# Patient Record
Sex: Female | Born: 1965 | Race: White | Hispanic: No | Marital: Married | Smoking: Never smoker
Health system: Southern US, Community
[De-identification: ages and names within clinical notes are randomized; demographics above are authoritative.]

## PROBLEM LIST (undated history)

## (undated) DIAGNOSIS — Z789 Other specified health status: Secondary | ICD-10-CM

## (undated) HISTORY — PX: WISDOM TOOTH EXTRACTION: SHX21

## (undated) HISTORY — PX: LAPAROSCOPIC GASTRIC SLEEVE RESECTION: SHX5895

---

## 2001-09-09 ENCOUNTER — Other Ambulatory Visit: Admission: RE | Admit: 2001-09-09 | Discharge: 2001-09-09 | Payer: Self-pay | Admitting: Obstetrics and Gynecology

## 2001-11-04 ENCOUNTER — Emergency Department (HOSPITAL_COMMUNITY): Admission: EM | Admit: 2001-11-04 | Discharge: 2001-11-05 | Payer: Self-pay | Admitting: Emergency Medicine

## 2001-11-05 ENCOUNTER — Encounter: Payer: Self-pay | Admitting: Emergency Medicine

## 2008-09-09 ENCOUNTER — Emergency Department (HOSPITAL_COMMUNITY): Admission: EM | Admit: 2008-09-09 | Discharge: 2008-09-09 | Payer: Self-pay | Admitting: Emergency Medicine

## 2010-03-01 ENCOUNTER — Encounter: Admission: RE | Admit: 2010-03-01 | Discharge: 2010-03-01 | Payer: Self-pay | Admitting: Obstetrics and Gynecology

## 2010-12-12 LAB — POCT I-STAT, CHEM 8
BUN: 11 mg/dL (ref 6–23)
Calcium, Ion: 1.06 mmol/L — ABNORMAL LOW (ref 1.12–1.32)
Chloride: 106 mEq/L (ref 96–112)
Creatinine, Ser: 0.8 mg/dL (ref 0.4–1.2)
Glucose, Bld: 108 mg/dL — ABNORMAL HIGH (ref 70–99)
HCT: 40 % (ref 36.0–46.0)
Hemoglobin: 13.6 g/dL (ref 12.0–15.0)
Potassium: 3.7 mEq/L (ref 3.5–5.1)
Sodium: 140 mEq/L (ref 135–145)
TCO2: 23 mmol/L (ref 0–100)

## 2010-12-12 LAB — POCT CARDIAC MARKERS
CKMB, poc: <1 ng/mL — ABNORMAL LOW (ref 1.0–8.0)
Myoglobin, poc: 59.1 ng/mL (ref 12–200)
Troponin i, poc: <0.05 ng/mL (ref 0.00–0.09)

## 2010-12-12 LAB — D-DIMER, QUANTITATIVE: D-Dimer, Quant: 0.48 ug/mL-FEU (ref 0.00–0.48)

## 2011-12-21 ENCOUNTER — Other Ambulatory Visit: Payer: Self-pay | Admitting: Family Medicine

## 2011-12-21 DIAGNOSIS — Z1231 Encounter for screening mammogram for malignant neoplasm of breast: Secondary | ICD-10-CM

## 2011-12-26 ENCOUNTER — Ambulatory Visit
Admission: RE | Admit: 2011-12-26 | Discharge: 2011-12-26 | Disposition: A | Discharge status: Home / Self Care | Payer: BC Managed Care – PPO | Source: Ambulatory Visit | Attending: Family Medicine | Admitting: Family Medicine

## 2011-12-26 DIAGNOSIS — Z1231 Encounter for screening mammogram for malignant neoplasm of breast: Secondary | ICD-10-CM

## 2012-10-25 ENCOUNTER — Other Ambulatory Visit: Payer: Self-pay | Admitting: Obstetrics and Gynecology

## 2012-10-28 ENCOUNTER — Encounter (HOSPITAL_COMMUNITY): Payer: Self-pay | Admitting: *Deleted

## 2012-10-28 ENCOUNTER — Encounter (HOSPITAL_COMMUNITY): Payer: Self-pay | Admitting: Pharmacist

## 2012-10-29 ENCOUNTER — Other Ambulatory Visit: Payer: Self-pay | Admitting: Obstetrics and Gynecology

## 2012-10-29 NOTE — H&P (Signed)
NAMEMARESA, Kathy Woods NO.:  [id number]  MEDICAL RECORD NO.:  [id number]  LOCATION:  PERIO                         FACILITY:  [facility]  PHYSICIAN:  Malachi Pro. Ambrose Mantle, M.D. DATE OF BIRTH:  1966-01-02  DATE OF ADMISSION:  10/30/2012 DATE OF DISCHARGE:                             HISTORY & PHYSICAL   PRESENT ILLNESS:  This is a 47 year old white female, para 1-0-0-1, who has a more than 1 year history of abnormal uterine bleeding.  Last menstrual period October 19, 2012, for 6 days.  She has spotted every day since.  Prior September 21, 2012, for 6 days spotted every day until her last menstrual period.  In September, 2013, she reported that her periods were regular, but that she would have a period for week, then for the next 2 weeks, she would have some type of bloody or brown discharge.  She would only have 1 week of the month that had no bleeding.  She stated this had been occurring for 1 year.  The patient was scheduled for an ultrasound and the ultrasound showed really no uterine abnormality.  The endometrial thickness was 7.02 mm, but there was a echo free right ovarian cyst and what was thought to be a hydrosalpinx in the left adnexa.  A left ovarian cyst had a heterogeneous echo pattern that filled the lumen, but the cyst only measured 1.1 x 1.46 cm.  The patient was scheduled for an endometrial biopsy and was unable to sound the uterus with even the smallest dilator.  I tried again 1 month later and again it did not show any evidence that I could sound the uterus, so I was unable to perform the biopsy.  Repeat ultrasound on October 14, 2012, showed an endometrial thickness of only 4.37 mm.  At this time, there was a fibroid that measured 4.6 x 6.8 mm.  A right ovarian cyst, 3.33 cm x 3.36 x 3.42 cm had smooth margins and contained numerous thin septations and low-level echoes.  Cyst was avascular.  A small left ovarian cyst 1.39 x 1.53 cm contained a fairly  homogeneous low level echo pattern.  There was continued presence of a 38 x 15 mm structure inferior and medial to the left ovary that was thought to represent a hydrosalpinx.  Because of the patient's persistent abnormal uterine bleeding, she is admitted now for D and C and hysteroscopy hoping that I will be able to enter the endometrial cavity under anesthesia.  PAST MEDICAL HISTORY:  Reveals allergy to Lipitor.  No latex allergy. She had a C-section.  She has been obese.  SURGICAL HISTORY:  C-section.  SOCIAL HISTORY:  Denies current alcohol, illicit substance, and tobacco use.  MEDICATIONS:  She has been given 2 cycles of progesterone in the last 4 months without benefit.  FAMILY HISTORY:  There is a family history of breast cancer.  PHYSICAL EXAMINATION:  GENERAL:  The patient is 5 feet, 7 and last recorded weight is 293 pounds. HEAD EYES, NOSE, AND THROAT:  Normal. VITAL SIGNS:  Blood pressure is 128/84. HEART:  Normal size and sounds.  No murmurs. NECK:  Thyroid normal size. LUNGS:  Clear to  auscultation. BREASTS:  Soft without masses. ABDOMEN:  Soft, obese with no masses.  Vulva and vagina show a slightly bloody discharge.  The cervix is clean.  The external os of the cervix is open.  The lower part of the uterus feels firm, but 2 ultrasounds that failed to show a significant size fibroid.  The exact size of the uterus is difficult to determine partially because of the patient's obesity.  The adnexa are free of masses.  ADMITTING IMPRESSION:  Persistent abnormal uterine bleeding in spite of hormonal therapy.  The patient is admitted for D and C, hysteroscopy. She has been informed of the risks of surgery including, but not limited to, injury to pelvic organs, perforation of the uterus, bleeding, possibly requiring hysterectomy, deep venous thrombosis, and infection.     Malachi Pro. Ambrose Mantle, M.D.     TFH/MEDQ  D:  10/29/2012  T:  10/29/2012  Job:  [id number]

## 2012-10-30 ENCOUNTER — Ambulatory Visit (HOSPITAL_COMMUNITY)
Admission: RE | Admit: 2012-10-30 | Discharge: 2012-10-30 | Disposition: A | Discharge status: Home / Self Care | Payer: BC Managed Care – PPO | Source: Ambulatory Visit | Attending: Obstetrics and Gynecology | Admitting: Obstetrics and Gynecology

## 2012-10-30 ENCOUNTER — Encounter (HOSPITAL_COMMUNITY): Payer: Self-pay | Admitting: Anesthesiology

## 2012-10-30 ENCOUNTER — Encounter (HOSPITAL_COMMUNITY)
Admission: RE | Disposition: A | Discharge status: Home / Self Care | Payer: Self-pay | Source: Ambulatory Visit | Attending: Obstetrics and Gynecology

## 2012-10-30 ENCOUNTER — Ambulatory Visit (HOSPITAL_COMMUNITY): Payer: BC Managed Care – PPO | Admitting: Anesthesiology

## 2012-10-30 DIAGNOSIS — N938 Other specified abnormal uterine and vaginal bleeding: Secondary | ICD-10-CM | POA: Insufficient documentation

## 2012-10-30 DIAGNOSIS — N949 Unspecified condition associated with female genital organs and menstrual cycle: Secondary | ICD-10-CM | POA: Insufficient documentation

## 2012-10-30 DIAGNOSIS — N882 Stricture and stenosis of cervix uteri: Secondary | ICD-10-CM | POA: Insufficient documentation

## 2012-10-30 DIAGNOSIS — D259 Leiomyoma of uterus, unspecified: Secondary | ICD-10-CM | POA: Insufficient documentation

## 2012-10-30 DIAGNOSIS — N83209 Unspecified ovarian cyst, unspecified side: Secondary | ICD-10-CM | POA: Insufficient documentation

## 2012-10-30 HISTORY — PX: HYSTEROSCOPY W/D&C: SHX1775

## 2012-10-30 HISTORY — DX: Other specified health status: Z78.9

## 2012-10-30 LAB — COMPREHENSIVE METABOLIC PANEL
ALT: 20 U/L (ref 0–35)
AST: 22 U/L (ref 0–37)
CO2: 23 mEq/L (ref 19–32)
Chloride: 104 mEq/L (ref 96–112)
Creatinine, Ser: 0.78 mg/dL (ref 0.50–1.10)
GFR calc Af Amer: >90 mL/min (ref >90)
GFR calc non Af Amer: >90 mL/min (ref >90)
Glucose, Bld: 112 mg/dL — ABNORMAL HIGH (ref 70–99)
Total Bilirubin: 0.3 mg/dL (ref 0.3–1.2)

## 2012-10-30 LAB — URINALYSIS, ROUTINE W REFLEX MICROSCOPIC
Glucose, UA: NEGATIVE mg/dL
Ketones, ur: NEGATIVE mg/dL
Protein, ur: NEGATIVE mg/dL
Urobilinogen, UA: 0.2 mg/dL (ref 0.0–1.0)

## 2012-10-30 LAB — CBC WITH DIFFERENTIAL/PLATELET
Basophils Absolute: 0 10*3/uL (ref 0.0–0.1)
Eosinophils Relative: 1 % (ref 0–5)
HCT: 37.4 % (ref 36.0–46.0)
Hemoglobin: 13 g/dL (ref 12.0–15.0)
Lymphocytes Relative: 27 % (ref 12–46)
Lymphs Abs: 2.6 10*3/uL (ref 0.7–4.0)
MCV: 88.4 fL (ref 78.0–100.0)
Monocytes Absolute: 0.7 10*3/uL (ref 0.1–1.0)
Neutro Abs: 6.2 10*3/uL (ref 1.7–7.7)
RBC: 4.23 MIL/uL (ref 3.87–5.11)
RDW: 12.4 % (ref 11.5–15.5)
WBC: 9.7 10*3/uL (ref 4.0–10.5)

## 2012-10-30 SURGERY — DILATATION AND CURETTAGE /HYSTEROSCOPY
Anesthesia: General | Site: Uterus | Wound class: Clean Contaminated

## 2012-10-30 MED ORDER — LIDOCAINE HCL (CARDIAC) 20 MG/ML IV SOLN
INTRAVENOUS | Status: AC
  Filled 2012-10-30: qty 5

## 2012-10-30 MED ORDER — ONDANSETRON HCL 4 MG/2ML IJ SOLN
INTRAMUSCULAR | Status: DC | PRN
  Administered 2012-10-30: 4 mg via INTRAVENOUS

## 2012-10-30 MED ORDER — LIDOCAINE HCL (CARDIAC) 20 MG/ML IV SOLN
INTRAVENOUS | Status: DC | PRN
  Administered 2012-10-30: 100 mg via INTRAVENOUS

## 2012-10-30 MED ORDER — FENTANYL CITRATE 0.05 MG/ML IJ SOLN
INTRAMUSCULAR | Status: AC
  Filled 2012-10-30: qty 2

## 2012-10-30 MED ORDER — OXYCODONE-ACETAMINOPHEN 5-325 MG PO TABS
ORAL_TABLET | ORAL | Status: AC
  Filled 2012-10-30: qty 1

## 2012-10-30 MED ORDER — PROPOFOL 10 MG/ML IV EMUL
INTRAVENOUS | Status: DC | PRN
  Administered 2012-10-30: 250 mg via INTRAVENOUS

## 2012-10-30 MED ORDER — KETOROLAC TROMETHAMINE 30 MG/ML IJ SOLN
INTRAMUSCULAR | Status: DC | PRN
  Administered 2012-10-30: 30 mg via INTRAVENOUS

## 2012-10-30 MED ORDER — PROPOFOL 10 MG/ML IV EMUL
INTRAVENOUS | Status: AC
  Filled 2012-10-30: qty 20

## 2012-10-30 MED ORDER — FENTANYL CITRATE 0.05 MG/ML IJ SOLN
INTRAMUSCULAR | Status: AC
  Administered 2012-10-30: 50 ug via INTRAVENOUS
  Filled 2012-10-30: qty 2

## 2012-10-30 MED ORDER — GLYCINE 1.5 % IR SOLN
Status: DC | PRN
  Administered 2012-10-30: 3000 mL

## 2012-10-30 MED ORDER — OXYCODONE-ACETAMINOPHEN 5-325 MG PO TABS
1.0000 | ORAL_TABLET | ORAL | Status: DC | PRN
  Administered 2012-10-30: 1 via ORAL

## 2012-10-30 MED ORDER — MIDAZOLAM HCL 5 MG/5ML IJ SOLN
INTRAMUSCULAR | Status: DC | PRN
  Administered 2012-10-30: 2 mg via INTRAVENOUS

## 2012-10-30 MED ORDER — LIDOCAINE HCL 1 % IJ SOLN
INTRAMUSCULAR | Status: DC | PRN
  Administered 2012-10-30: 20 mL

## 2012-10-30 MED ORDER — DEXAMETHASONE SODIUM PHOSPHATE 10 MG/ML IJ SOLN
INTRAMUSCULAR | Status: AC
  Filled 2012-10-30: qty 1

## 2012-10-30 MED ORDER — LACTATED RINGERS IV SOLN
INTRAVENOUS | Status: DC
  Administered 2012-10-30 (×2): via INTRAVENOUS

## 2012-10-30 MED ORDER — ONDANSETRON HCL 4 MG/2ML IJ SOLN: 4.0000 mg | Freq: Once | INTRAMUSCULAR | Status: DC | PRN

## 2012-10-30 MED ORDER — MEPERIDINE HCL 25 MG/ML IJ SOLN: 6.2500 mg | INTRAMUSCULAR | Status: DC | PRN

## 2012-10-30 MED ORDER — ONDANSETRON HCL 4 MG/2ML IJ SOLN
INTRAMUSCULAR | Status: AC
  Filled 2012-10-30: qty 2

## 2012-10-30 MED ORDER — MIDAZOLAM HCL 2 MG/2ML IJ SOLN
INTRAMUSCULAR | Status: AC
  Filled 2012-10-30: qty 2

## 2012-10-30 MED ORDER — FENTANYL CITRATE 0.05 MG/ML IJ SOLN
25.0000 ug | INTRAMUSCULAR | Status: DC | PRN
  Administered 2012-10-30: 50 ug via INTRAVENOUS

## 2012-10-30 MED ORDER — DEXAMETHASONE SODIUM PHOSPHATE 4 MG/ML IJ SOLN
INTRAMUSCULAR | Status: DC | PRN
  Administered 2012-10-30: 10 mg via INTRAVENOUS

## 2012-10-30 MED ORDER — FENTANYL CITRATE 0.05 MG/ML IJ SOLN
INTRAMUSCULAR | Status: DC | PRN
  Administered 2012-10-30: 100 ug via INTRAVENOUS

## 2012-10-30 MED ORDER — KETOROLAC TROMETHAMINE 30 MG/ML IJ SOLN: 15.0000 mg | Freq: Once | INTRAMUSCULAR | Status: DC | PRN

## 2012-10-30 SURGICAL SUPPLY — 14 items
CANISTER SUCTION 2500CC (MISCELLANEOUS) ×2 IMPLANT
CATH ROBINSON RED A/P 16FR (CATHETERS) ×2 IMPLANT
CLOTH BEACON ORANGE TIMEOUT ST (SAFETY) ×2 IMPLANT
CONTAINER PREFILL 10% NBF 60ML (FORM) ×4 IMPLANT
DRESSING TELFA 8X3 (GAUZE/BANDAGES/DRESSINGS) ×2 IMPLANT
ELECT REM PT RETURN 9FT ADLT (ELECTROSURGICAL)
ELECTRODE REM PT RTRN 9FT ADLT (ELECTROSURGICAL) IMPLANT
GLOVE BIO SURGEON STRL SZ7.5 (GLOVE) ×2 IMPLANT
GOWN PREVENTION PLUS XLARGE (GOWN DISPOSABLE) ×2 IMPLANT
GOWN STRL REIN XL XLG (GOWN DISPOSABLE) ×4 IMPLANT
PACK HYSTEROSCOPY LF (CUSTOM PROCEDURE TRAY) ×2 IMPLANT
PAD OB MATERNITY 4.3X12.25 (PERSONAL CARE ITEMS) ×2 IMPLANT
TOWEL OR 17X24 6PK STRL BLUE (TOWEL DISPOSABLE) ×4 IMPLANT
WATER STERILE IRR 1000ML POUR (IV SOLUTION) ×2 IMPLANT

## 2012-10-30 NOTE — Progress Notes (Signed)
Patient ID: Kathy Woods, female   DOB: 1965-09-16, 47 y.o.   MRN: [id number] I examined this lady 10-30-12 and she reports no change in her health since that time.

## 2012-10-30 NOTE — Op Note (Signed)
Operative note on Kathy Woods  Date of the operation: 10/31/2012  Preoperative diagnosis: Abnormal uterine bleeding in spite of 2 cycles of progesterone, cervical stenosis, inability to sound the uterus in the office  Postoperative diagnosis: Same  Operation: D. And C., hysteroscopy  Operator: Ambrose Mantle  Anesthesia MAC, Dr. Arby Barrette CRNA [place]  The patient was brought to the operating room and placed under MAC anesthesia. She was placed in the Wallace stirrups. A timeout was done. She was placed in lithotomy position and the vulva, vagina, perineum and urethra were prepped with Betadine solution.The bladder was catheterized with a [place] catheter and emptied. Exam revealed the external os to be open, the uterus was difficult to feel but was thought to be slightly anterior, normal size, and poorly movable. The adnexa were free of masses. The area was then draped as a sterile field. Speculums were used to identify the cervix which was grasped with a single-tooth tenaculum. The cervical canal was patent but it angulated anteriorly. I dilated the cervix up to where I could easily insert the hysteroscope and visualize the entire endocervical and endometrial canals. There were no polyps or fibroids seen. I then did an endocervical curettage followed by an endometrial curettage and used polyp forceps and baby ring forceps to preserve all the tissue that was removed. The tenaculum was removed and there was no bleeding from the tenaculum site so the procedure was terminated. Blood loss 10-15 cc. Sponge and needle counts correct

## 2012-10-30 NOTE — Anesthesia Procedure Notes (Signed)
Procedure Name: LMA Insertion Date/Time: 10/30/2012 7:33 AM Performed by: Graciela Husbands Pre-anesthesia Checklist: Emergency Drugs available, Timeout performed, Suction available, Patient identified and Patient being monitored Patient Re-evaluated:Patient Re-evaluated prior to inductionOxygen Delivery Method: Circle system utilized Preoxygenation: Pre-oxygenation with 100% oxygen Intubation Type: IV induction Ventilation: Mask ventilation without difficulty LMA: LMA with gastric port inserted LMA Size: 4.0 Number of attempts: 1 Airway Equipment and Method: Patient positioned with wedge pillow Placement Confirmation: breath sounds checked- equal and bilateral and positive ETCO2 Tube secured with: Tape Dental Injury: Teeth and Oropharynx as per pre-operative assessment

## 2012-10-30 NOTE — Anesthesia Postprocedure Evaluation (Signed)
Anesthesia Post Note  Patient: Kathy Woods  Procedure(s) Performed: Procedure(s) (LRB): DILATATION AND CURETTAGE /HYSTEROSCOPY (N/A)  Anesthesia type: General  Patient location: PACU  Post pain: Pain level controlled  Post assessment: Post-op Vital signs reviewed  Last Vitals:  Filed Vitals:   10/30/12 0815  BP: 129/86  Pulse: 79  Temp:   Resp: 16    Post vital signs: Reviewed  Level of consciousness: sedated  Complications: No apparent anesthesia complications

## 2012-10-30 NOTE — Transfer of Care (Signed)
Immediate Anesthesia Transfer of Care Note  Patient: Kathy Woods  Procedure(s) Performed: Procedure(s): DILATATION AND CURETTAGE /HYSTEROSCOPY (N/A)  Patient Location: PACU  Anesthesia Type:General  Level of Consciousness: awake, alert  and oriented  Airway & Oxygen Therapy: Patient Spontanous Breathing and Patient connected to nasal cannula oxygen  Post-op Assessment: Report given to PACU RN and Post -op Vital signs reviewed and stable  Post vital signs: Reviewed and stable  Complications: No apparent anesthesia complications

## 2012-10-30 NOTE — Anesthesia Preprocedure Evaluation (Signed)
Anesthesia Evaluation  Patient identified by MRN, date of birth, ID band  Reviewed: Allergy & Precautions, H&P , NPO status , Patient's Chart, lab work & pertinent test results  Airway Mallampati: III TM Distance: >3 FB Neck ROM: full    Dental no notable dental hx. (+) Teeth Intact   Pulmonary neg pulmonary ROS,    Pulmonary exam normal       Cardiovascular negative cardio ROS      Neuro/Psych negative neurological ROS  negative psych ROS   GI/Hepatic negative GI ROS, Neg liver ROS,   Endo/Other  Morbid obesity  Renal/GU negative Renal ROS  negative genitourinary   Musculoskeletal negative musculoskeletal ROS (+)   Abdominal (+) + obese,   Peds negative pediatric ROS (+)  Hematology negative hematology ROS (+)   Anesthesia Other Findings   Reproductive/Obstetrics negative OB ROS                           Anesthesia Physical Anesthesia Plan  ASA: III  Anesthesia Plan: General   Post-op Pain Management:    Induction: Intravenous  Airway Management Planned:   Additional Equipment:   Intra-op Plan:   Post-operative Plan:   Informed Consent: I have reviewed the patients History and Physical, chart, labs and discussed the procedure including the risks, benefits and alternatives for the proposed anesthesia with the patient or authorized representative who has indicated his/her understanding and acceptance.     Plan Discussed with: CRNA and Surgeon  Anesthesia Plan Comments:         Anesthesia Quick Evaluation

## 2012-10-31 ENCOUNTER — Encounter (HOSPITAL_COMMUNITY): Payer: Self-pay | Admitting: Obstetrics and Gynecology

## 2012-12-05 ENCOUNTER — Other Ambulatory Visit: Payer: Self-pay | Admitting: Obstetrics and Gynecology

## 2012-12-05 DIAGNOSIS — Z1231 Encounter for screening mammogram for malignant neoplasm of breast: Secondary | ICD-10-CM

## 2013-01-14 ENCOUNTER — Ambulatory Visit (INDEPENDENT_AMBULATORY_CARE_PROVIDER_SITE_OTHER): Payer: BC Managed Care – PPO

## 2013-01-14 DIAGNOSIS — Z1231 Encounter for screening mammogram for malignant neoplasm of breast: Secondary | ICD-10-CM

## 2014-04-02 ENCOUNTER — Other Ambulatory Visit: Payer: Self-pay | Admitting: Obstetrics and Gynecology

## 2014-04-02 DIAGNOSIS — Z1231 Encounter for screening mammogram for malignant neoplasm of breast: Secondary | ICD-10-CM

## 2014-04-21 ENCOUNTER — Ambulatory Visit (INDEPENDENT_AMBULATORY_CARE_PROVIDER_SITE_OTHER): Payer: BC Managed Care – PPO

## 2014-04-21 DIAGNOSIS — Z1231 Encounter for screening mammogram for malignant neoplasm of breast: Secondary | ICD-10-CM

## 2014-06-19 ENCOUNTER — Other Ambulatory Visit (HOSPITAL_COMMUNITY): Payer: Self-pay | Admitting: Obstetrics and Gynecology

## 2014-06-19 DIAGNOSIS — N838 Other noninflammatory disorders of ovary, fallopian tube and broad ligament: Secondary | ICD-10-CM

## 2014-06-23 ENCOUNTER — Ambulatory Visit (HOSPITAL_COMMUNITY)
Admission: RE | Admit: 2014-06-23 | Discharge: 2014-06-23 | Disposition: A | Discharge status: Home / Self Care | Payer: BC Managed Care – PPO | Source: Ambulatory Visit | Attending: Obstetrics and Gynecology | Admitting: Obstetrics and Gynecology

## 2014-06-23 DIAGNOSIS — N838 Other noninflammatory disorders of ovary, fallopian tube and broad ligament: Secondary | ICD-10-CM

## 2014-06-23 DIAGNOSIS — N888 Other specified noninflammatory disorders of cervix uteri: Secondary | ICD-10-CM | POA: Diagnosis not present

## 2014-06-23 DIAGNOSIS — N839 Noninflammatory disorder of ovary, fallopian tube and broad ligament, unspecified: Secondary | ICD-10-CM | POA: Diagnosis present

## 2014-06-23 DIAGNOSIS — N8329 Other ovarian cysts: Secondary | ICD-10-CM | POA: Insufficient documentation

## 2015-04-13 ENCOUNTER — Other Ambulatory Visit: Payer: Self-pay | Admitting: Obstetrics and Gynecology

## 2015-04-13 DIAGNOSIS — Z139 Encounter for screening, unspecified: Secondary | ICD-10-CM

## 2015-05-04 ENCOUNTER — Ambulatory Visit (INDEPENDENT_AMBULATORY_CARE_PROVIDER_SITE_OTHER): Payer: BLUE CROSS/BLUE SHIELD

## 2015-05-04 DIAGNOSIS — Z139 Encounter for screening, unspecified: Secondary | ICD-10-CM

## 2015-05-04 DIAGNOSIS — Z1231 Encounter for screening mammogram for malignant neoplasm of breast: Secondary | ICD-10-CM | POA: Diagnosis not present

## 2015-06-08 NOTE — H&P (Addendum)
Kathy Woods is an 49 y.o. female G2P1 with menorrhagia presents for definitive surgical mngt.      Menstrual History: No LMP recorded.    Past Medical History  Diagnosis Date  . Medical history non-contributory     Past Surgical History  Procedure Laterality Date  . Cesarean section    . Wisdom tooth extraction    . Hysteroscopy w/d&c N/A 10/30/2012    Procedure: DILATATION AND CURETTAGE /HYSTEROSCOPY;  Surgeon: Melina Schools, MD;  Location: [facility];  Service: Gynecology;  Laterality: N/A;  **pt had vertical sleeve gastrectomy   Social History:  reports that she has never smoked. She has never used smokeless tobacco. She reports that she does not drink alcohol. Her drug history is not on file.  Allergies:  Allergies  Allergen Reactions  . Lipitor [Atorvastatin] Other (See Comments)    Muscle tightness in arm    No prescriptions prior to admission    ROS  AF, VSS  Physical Exam  Gen - NAD ABd - soft, NT, NT CV - RRR Lungs - clear PV - uterus mobile, NT no adnexal masses  PV [place]:  No uterine fibroids or masses.  No free fluid  Assessment/Plan:  Menorrhagia LAVH/BSO R/b/a discussed, questions answered, informed consent  Kathy Woods 06/08/2015, 5:24 PM

## 2015-06-14 ENCOUNTER — Encounter (HOSPITAL_COMMUNITY): Payer: Self-pay

## 2015-06-14 ENCOUNTER — Encounter (HOSPITAL_COMMUNITY)
Admission: RE | Admit: 2015-06-14 | Discharge: 2015-06-14 | Disposition: A | Discharge status: Home / Self Care | Payer: BLUE CROSS/BLUE SHIELD | Source: Ambulatory Visit | Attending: Obstetrics and Gynecology | Admitting: Obstetrics and Gynecology

## 2015-06-14 DIAGNOSIS — Z01818 Encounter for other preprocedural examination: Secondary | ICD-10-CM | POA: Diagnosis not present

## 2015-06-14 DIAGNOSIS — N920 Excessive and frequent menstruation with regular cycle: Secondary | ICD-10-CM | POA: Insufficient documentation

## 2015-06-14 LAB — CBC
HEMATOCRIT: 39.4 % (ref 36.0–46.0)
Hemoglobin: 13.5 g/dL (ref 12.0–15.0)
MCH: 31.2 pg (ref 26.0–34.0)
MCHC: 34.3 g/dL (ref 30.0–36.0)
MCV: 91 fL (ref 78.0–100.0)
Platelets: 255 10*3/uL (ref 150–400)
RBC: 4.33 MIL/uL (ref 3.87–5.11)
RDW: 12 % (ref 11.5–15.5)
WBC: 6 10*3/uL (ref 4.0–10.5)

## 2015-06-14 LAB — COMPREHENSIVE METABOLIC PANEL
ALK PHOS: 73 U/L (ref 38–126)
ALT: 27 U/L (ref 14–54)
ANION GAP: 4 — AB (ref 5–15)
AST: 28 U/L (ref 15–41)
Albumin: 4 g/dL (ref 3.5–5.0)
BILIRUBIN TOTAL: 0.6 mg/dL (ref 0.3–1.2)
BUN: 14 mg/dL (ref 6–20)
CALCIUM: 8.9 mg/dL (ref 8.9–10.3)
CO2: 27 mmol/L (ref 22–32)
Chloride: 107 mmol/L (ref 101–111)
Creatinine, Ser: 0.7 mg/dL (ref 0.44–1.00)
GFR calc Af Amer: >60 mL/min (ref >60)
Glucose, Bld: 78 mg/dL (ref 65–99)
POTASSIUM: 3.8 mmol/L (ref 3.5–5.1)
Sodium: 138 mmol/L (ref 135–145)
TOTAL PROTEIN: 7.1 g/dL (ref 6.5–8.1)

## 2015-06-14 LAB — TYPE AND SCREEN
ABO/RH(D): O POS
Antibody Screen: NEGATIVE

## 2015-06-14 LAB — ABO/RH: ABO/RH(D): O POS

## 2015-06-14 NOTE — Patient Instructions (Addendum)
   Your procedure is scheduled on: OCT 24 AT 730AM  Enter through the Main Entrance of [facility] at: 6AM  Pick up the phone at the desk and dial [phone] and inform us of your arrival.  Please call this number if you have any problems the morning of surgery: [phone]  DO NOT EAT OR DRINK AFTER MIDNIGHT OCT 23 (SUNDAY)    Do not wear jewelry, make-up, or FINGER nail polish No metal in your hair or on your body. Do not wear lotions, powders, perfumes.  You may wear deodorant.  Do not bring valuables to the hospital. Contacts, dentures or bridgework may not be worn into surgery.  Leave suitcase in the car. After Surgery it may be brought to your room. For patients being admitted to the hospital, checkout time is 11:00am the day of discharge.

## 2015-06-20 MED ORDER — DEXTROSE 5 % IV SOLN
2.0000 g | INTRAVENOUS | Status: AC
  Administered 2015-06-21: 2 g via INTRAVENOUS
  Filled 2015-06-20: qty 2

## 2015-06-20 MED ORDER — CEFOTETAN DISODIUM 2 G IJ SOLR
2.0000 g | INTRAMUSCULAR | Status: DC
  Filled 2015-06-20: qty 2

## 2015-06-21 ENCOUNTER — Encounter (HOSPITAL_COMMUNITY): Payer: Self-pay

## 2015-06-21 ENCOUNTER — Ambulatory Visit (HOSPITAL_COMMUNITY): Payer: BLUE CROSS/BLUE SHIELD | Admitting: Anesthesiology

## 2015-06-21 ENCOUNTER — Encounter (HOSPITAL_COMMUNITY)
Admission: AD | Disposition: A | Discharge status: Home / Self Care | Payer: Self-pay | Source: Ambulatory Visit | Attending: Obstetrics and Gynecology

## 2015-06-21 ENCOUNTER — Inpatient Hospital Stay (HOSPITAL_COMMUNITY)
Admission: AD | Admit: 2015-06-21 | Discharge: 2015-06-23 | DRG: 743 | Disposition: A | Discharge status: Home / Self Care | Payer: BLUE CROSS/BLUE SHIELD | Source: Ambulatory Visit | Attending: Obstetrics and Gynecology | Admitting: Obstetrics and Gynecology

## 2015-06-21 DIAGNOSIS — N920 Excessive and frequent menstruation with regular cycle: Principal | ICD-10-CM | POA: Diagnosis present

## 2015-06-21 DIAGNOSIS — N809 Endometriosis, unspecified: Secondary | ICD-10-CM | POA: Diagnosis present

## 2015-06-21 HISTORY — PX: SALPINGOOPHORECTOMY: SHX82

## 2015-06-21 HISTORY — PX: LAPAROSCOPY: SHX197

## 2015-06-21 HISTORY — PX: LYSIS OF ADHESION: SHX5961

## 2015-06-21 HISTORY — PX: ABDOMINAL HYSTERECTOMY: SHX81

## 2015-06-21 SURGERY — SALPINGO-OOPHORECTOMY, OPEN
Anesthesia: General | Site: Abdomen

## 2015-06-21 MED ORDER — ACETAMINOPHEN 160 MG/5ML PO SOLN
975.0000 mg | Freq: Once | ORAL | Status: AC
  Administered 2015-06-21: 975 mg via ORAL

## 2015-06-21 MED ORDER — SUCCINYLCHOLINE CHLORIDE 20 MG/ML IJ SOLN
INTRAMUSCULAR | Status: AC
  Filled 2015-06-21: qty 1

## 2015-06-21 MED ORDER — KETOROLAC TROMETHAMINE 30 MG/ML IJ SOLN
INTRAMUSCULAR | Status: DC | PRN
  Administered 2015-06-21: 30 mg via INTRAVENOUS

## 2015-06-21 MED ORDER — ONDANSETRON HCL 4 MG/2ML IJ SOLN: 4.0000 mg | Freq: Four times a day (QID) | INTRAMUSCULAR | Status: DC | PRN

## 2015-06-21 MED ORDER — BUPIVACAINE HCL (PF) 0.25 % IJ SOLN
INTRAMUSCULAR | Status: AC
  Filled 2015-06-21: qty 30

## 2015-06-21 MED ORDER — MIDAZOLAM HCL 2 MG/2ML IJ SOLN
INTRAMUSCULAR | Status: AC
  Filled 2015-06-21: qty 4

## 2015-06-21 MED ORDER — SCOPOLAMINE 1 MG/3DAYS TD PT72
MEDICATED_PATCH | TRANSDERMAL | Status: AC
  Filled 2015-06-21: qty 1

## 2015-06-21 MED ORDER — SCOPOLAMINE 1 MG/3DAYS TD PT72
1.0000 | MEDICATED_PATCH | Freq: Once | TRANSDERMAL | Status: DC
  Administered 2015-06-21: 1.5 mg via TRANSDERMAL

## 2015-06-21 MED ORDER — BUPIVACAINE HCL (PF) 0.25 % IJ SOLN
INTRAMUSCULAR | Status: DC | PRN
  Administered 2015-06-21: 5 mL

## 2015-06-21 MED ORDER — CEFAZOLIN SODIUM-DEXTROSE 2-3 GM-% IV SOLR
INTRAVENOUS | Status: AC
  Filled 2015-06-21: qty 50

## 2015-06-21 MED ORDER — ONDANSETRON HCL 4 MG/2ML IJ SOLN
INTRAMUSCULAR | Status: DC | PRN
  Administered 2015-06-21: 4 mg via INTRAVENOUS

## 2015-06-21 MED ORDER — ROCURONIUM BROMIDE 100 MG/10ML IV SOLN
INTRAVENOUS | Status: DC | PRN
  Administered 2015-06-21: 10 mg via INTRAVENOUS
  Administered 2015-06-21: 50 mg via INTRAVENOUS
  Administered 2015-06-21 (×2): 10 mg via INTRAVENOUS

## 2015-06-21 MED ORDER — MIDAZOLAM HCL 5 MG/5ML IJ SOLN
INTRAMUSCULAR | Status: DC | PRN
  Administered 2015-06-21: 2 mg via INTRAVENOUS

## 2015-06-21 MED ORDER — KETOROLAC TROMETHAMINE 30 MG/ML IJ SOLN
INTRAMUSCULAR | Status: AC
  Filled 2015-06-21: qty 1

## 2015-06-21 MED ORDER — SODIUM CHLORIDE 0.9 % IJ SOLN: 9.0000 mL | INTRAMUSCULAR | Status: DC | PRN

## 2015-06-21 MED ORDER — HYDROMORPHONE HCL 1 MG/ML IJ SOLN: 0.2500 mg | INTRAMUSCULAR | Status: DC | PRN

## 2015-06-21 MED ORDER — GLYCOPYRROLATE 0.2 MG/ML IJ SOLN
INTRAMUSCULAR | Status: AC
  Filled 2015-06-21: qty 4

## 2015-06-21 MED ORDER — ROCURONIUM BROMIDE 100 MG/10ML IV SOLN
INTRAVENOUS | Status: AC
  Filled 2015-06-21: qty 1

## 2015-06-21 MED ORDER — DIPHENHYDRAMINE HCL 12.5 MG/5ML PO ELIX: 12.5000 mg | ORAL_SOLUTION | Freq: Four times a day (QID) | ORAL | Status: DC | PRN

## 2015-06-21 MED ORDER — HYDROMORPHONE HCL 1 MG/ML IJ SOLN
0.2500 mg | INTRAMUSCULAR | Status: DC | PRN
  Administered 2015-06-21 (×4): 0.5 mg via INTRAVENOUS

## 2015-06-21 MED ORDER — OXYCODONE-ACETAMINOPHEN 5-325 MG PO TABS
1.0000 | ORAL_TABLET | ORAL | Status: DC | PRN
  Administered 2015-06-22 (×3): 1 via ORAL
  Filled 2015-06-21 (×3): qty 1

## 2015-06-21 MED ORDER — LACTATED RINGERS IV SOLN
INTRAVENOUS | Status: DC
  Administered 2015-06-21: 125 mL/h via INTRAVENOUS
  Administered 2015-06-21 (×2): via INTRAVENOUS

## 2015-06-21 MED ORDER — LIDOCAINE HCL (CARDIAC) 20 MG/ML IV SOLN
INTRAVENOUS | Status: AC
  Filled 2015-06-21: qty 5

## 2015-06-21 MED ORDER — HYDROMORPHONE 1 MG/ML IV SOLN
INTRAVENOUS | Status: DC
  Administered 2015-06-21: 0.2 mg via INTRAVENOUS
  Administered 2015-06-21: 12:00:00 via INTRAVENOUS
  Administered 2015-06-21: 1.8 mg via INTRAVENOUS
  Administered 2015-06-22: 0.6 mL via INTRAVENOUS
  Administered 2015-06-22: 0.8 mg via INTRAVENOUS
  Filled 2015-06-21: qty 25

## 2015-06-21 MED ORDER — ONDANSETRON HCL 4 MG PO TABS: 4.0000 mg | ORAL_TABLET | Freq: Four times a day (QID) | ORAL | Status: DC | PRN

## 2015-06-21 MED ORDER — PROPOFOL 10 MG/ML IV BOLUS
INTRAVENOUS | Status: DC | PRN
  Administered 2015-06-21: 200 mg via INTRAVENOUS

## 2015-06-21 MED ORDER — BUPIVACAINE LIPOSOME 1.3 % IJ SUSP
20.0000 mL | Freq: Once | INTRAMUSCULAR | Status: AC
  Administered 2015-06-21: 20 mL
  Filled 2015-06-21: qty 20

## 2015-06-21 MED ORDER — HYDROMORPHONE HCL 1 MG/ML IJ SOLN
INTRAMUSCULAR | Status: AC
  Filled 2015-06-21: qty 1

## 2015-06-21 MED ORDER — NEOSTIGMINE METHYLSULFATE 10 MG/10ML IV SOLN
INTRAVENOUS | Status: DC | PRN
  Administered 2015-06-21: 5 mg via INTRAVENOUS

## 2015-06-21 MED ORDER — DEXAMETHASONE SODIUM PHOSPHATE 10 MG/ML IJ SOLN
INTRAMUSCULAR | Status: DC | PRN
  Administered 2015-06-21: 10 mg via INTRAVENOUS

## 2015-06-21 MED ORDER — ONDANSETRON HCL 4 MG/2ML IJ SOLN
INTRAMUSCULAR | Status: AC
  Filled 2015-06-21: qty 2

## 2015-06-21 MED ORDER — DIPHENHYDRAMINE HCL 50 MG/ML IJ SOLN: 12.5000 mg | Freq: Four times a day (QID) | INTRAMUSCULAR | Status: DC | PRN

## 2015-06-21 MED ORDER — PROPOFOL 10 MG/ML IV BOLUS
INTRAVENOUS | Status: AC
  Filled 2015-06-21: qty 20

## 2015-06-21 MED ORDER — GLYCOPYRROLATE 0.2 MG/ML IJ SOLN
INTRAMUSCULAR | Status: DC | PRN
  Administered 2015-06-21: .8 mg via INTRAVENOUS

## 2015-06-21 MED ORDER — 0.9 % SODIUM CHLORIDE (POUR BTL) OPTIME
TOPICAL | Status: DC | PRN
  Administered 2015-06-21: 1000 mL

## 2015-06-21 MED ORDER — LIDOCAINE HCL (CARDIAC) 20 MG/ML IV SOLN
INTRAVENOUS | Status: DC | PRN
  Administered 2015-06-21: 100 mg via INTRAVENOUS

## 2015-06-21 MED ORDER — MENTHOL 3 MG MT LOZG: 1.0000 | LOZENGE | OROMUCOSAL | Status: DC | PRN

## 2015-06-21 MED ORDER — SODIUM CHLORIDE 0.9 % IJ SOLN
INTRAMUSCULAR | Status: AC
  Filled 2015-06-21: qty 20

## 2015-06-21 MED ORDER — FENTANYL CITRATE (PF) 100 MCG/2ML IJ SOLN
INTRAMUSCULAR | Status: DC | PRN
  Administered 2015-06-21 (×2): 100 ug via INTRAVENOUS
  Administered 2015-06-21: 50 ug via INTRAVENOUS

## 2015-06-21 MED ORDER — DEXTROSE IN LACTATED RINGERS 5 % IV SOLN
INTRAVENOUS | Status: DC
  Administered 2015-06-21 (×2): via INTRAVENOUS

## 2015-06-21 MED ORDER — KETOROLAC TROMETHAMINE 30 MG/ML IJ SOLN
30.0000 mg | Freq: Three times a day (TID) | INTRAMUSCULAR | Status: AC
  Administered 2015-06-21 – 2015-06-22 (×3): 30 mg via INTRAVENOUS
  Filled 2015-06-21 (×3): qty 1

## 2015-06-21 MED ORDER — NEOSTIGMINE METHYLSULFATE 10 MG/10ML IV SOLN
INTRAVENOUS | Status: AC
  Filled 2015-06-21: qty 1

## 2015-06-21 MED ORDER — FENTANYL CITRATE (PF) 250 MCG/5ML IJ SOLN
INTRAMUSCULAR | Status: AC
  Filled 2015-06-21: qty 25

## 2015-06-21 MED ORDER — ACETAMINOPHEN 160 MG/5ML PO SOLN
ORAL | Status: AC
  Filled 2015-06-21: qty 40.6

## 2015-06-21 MED ORDER — NALOXONE HCL 0.4 MG/ML IJ SOLN: 0.4000 mg | INTRAMUSCULAR | Status: DC | PRN

## 2015-06-21 MED ORDER — DEXAMETHASONE SODIUM PHOSPHATE 10 MG/ML IJ SOLN
INTRAMUSCULAR | Status: AC
  Filled 2015-06-21: qty 2

## 2015-06-21 SURGICAL SUPPLY — 47 items
ATTRACTOMAT 16X20 MAGNETIC DRP (DRAPES) ×5 IMPLANT
CABLE HIGH FREQUENCY MONO STRZ (ELECTRODE) IMPLANT
CANISTER SUCT 3000ML (MISCELLANEOUS) ×5 IMPLANT
CLOTH BEACON ORANGE TIMEOUT ST (SAFETY) ×5 IMPLANT
COVER BACK TABLE 60X90IN (DRAPES) ×5 IMPLANT
COVER MAYO STAND STRL (DRAPES) ×5 IMPLANT
DECANTER SPIKE VIAL GLASS SM (MISCELLANEOUS) ×5 IMPLANT
DRSG COVADERM PLUS 2X2 (GAUZE/BANDAGES/DRESSINGS) IMPLANT
DRSG OPSITE POSTOP 3X4 (GAUZE/BANDAGES/DRESSINGS) ×5 IMPLANT
DRSG OPSITE POSTOP 4X10 (GAUZE/BANDAGES/DRESSINGS) ×5 IMPLANT
DURAPREP 26ML APPLICATOR (WOUND CARE) ×5 IMPLANT
ELECT LIGASURE LONG (ELECTRODE) IMPLANT
ELECT LIGASURE SHORT 9 REUSE (ELECTRODE) IMPLANT
ELECT REM PT RETURN 9FT ADLT (ELECTROSURGICAL) ×5
ELECTRODE REM PT RTRN 9FT ADLT (ELECTROSURGICAL) ×3 IMPLANT
GLOVE BIO SURGEON STRL SZ 6.5 (GLOVE) ×8 IMPLANT
GLOVE BIO SURGEONS STRL SZ 6.5 (GLOVE) ×2
GLOVE BIOGEL PI IND STRL 6.5 (GLOVE) ×3 IMPLANT
GLOVE BIOGEL PI IND STRL 7.0 (GLOVE) ×6 IMPLANT
GLOVE BIOGEL PI INDICATOR 6.5 (GLOVE) ×2
GLOVE BIOGEL PI INDICATOR 7.0 (GLOVE) ×4
LEGGING LITHOTOMY PAIR STRL (DRAPES) ×5 IMPLANT
LIQUID BAND (GAUZE/BANDAGES/DRESSINGS) ×5 IMPLANT
NS IRRIG 1000ML POUR BTL (IV SOLUTION) ×5 IMPLANT
PACK LAVH (CUSTOM PROCEDURE TRAY) ×5 IMPLANT
PACK ROBOTIC GOWN (GOWN DISPOSABLE) ×5 IMPLANT
PAD POSITIONING PINK XL (MISCELLANEOUS) ×5 IMPLANT
SEALER TISSUE G2 CVD JAW 45CM (ENDOMECHANICALS) IMPLANT
SET IRRIG TUBING LAPAROSCOPIC (IRRIGATION / IRRIGATOR) IMPLANT
SLEEVE XCEL OPT CAN 5 100 (ENDOMECHANICALS) IMPLANT
SPONGE LAP 18X18 X RAY DECT (DISPOSABLE) ×15 IMPLANT
SUT MNCRL 0 MO-4 VIOLET 18 CR (SUTURE) ×6 IMPLANT
SUT MNCRL 0 VIOLET 6X18 (SUTURE) ×3 IMPLANT
SUT MON AB 2-0 CT1 36 (SUTURE) ×5 IMPLANT
SUT MON AB 3-0 SH 27 (SUTURE) ×2
SUT MON AB 3-0 SH27 (SUTURE) ×3 IMPLANT
SUT MONOCRYL 0 6X18 (SUTURE) ×2
SUT MONOCRYL 0 MO 4 18  CR/8 (SUTURE) ×4
SUT VIC AB 3-0 PS2 18 (SUTURE) ×2
SUT VIC AB 3-0 PS2 18XBRD (SUTURE) ×3 IMPLANT
SUT VICRYL 0 UR6 27IN ABS (SUTURE) ×5 IMPLANT
TOWEL OR 17X24 6PK STRL BLUE (TOWEL DISPOSABLE) ×10 IMPLANT
TRAY FOLEY CATH SILVER 14FR (SET/KITS/TRAYS/PACK) ×5 IMPLANT
TROCAR OPTI TIP 5M 100M (ENDOMECHANICALS) ×5 IMPLANT
TROCAR XCEL NON-BLD 11X100MML (ENDOMECHANICALS) ×5 IMPLANT
WARMER LAPAROSCOPE (MISCELLANEOUS) ×5 IMPLANT
WATER STERILE IRR 1000ML POUR (IV SOLUTION) IMPLANT

## 2015-06-21 NOTE — Transfer of Care (Signed)
Immediate Anesthesia Transfer of Care Note  Patient: Kathy Woods  Procedure(s) Performed: Procedure(s): BILATERAL SALPINGO OOPHORECTOMY (Bilateral) LAPAROSCOPY DIAGNOSTIC (N/A) HYSTERECTOMY ABDOMINAL (N/A) LYSIS OF ADHESION (N/A)  Patient Location: PACU  Anesthesia Type:General  Level of Consciousness: sedated  Airway & Oxygen Therapy: Patient Spontanous Breathing and Patient connected to nasal cannula oxygen  Post-op Assessment: Report given to RN and Post -op Vital signs reviewed and stable  Post vital signs: Reviewed and stable  Last Vitals:  Filed Vitals:   06/21/15 0559  BP: 123/76  Pulse: 77  Temp: 85.9 C    Complications: No apparent anesthesia complications

## 2015-06-21 NOTE — Op Note (Signed)
NAMESUSANE, BEY NO.:  [id number]  MEDICAL RECORD NO.:  [id number]  LOCATION:  2423                          FACILITY:  [facility]  PHYSICIAN:  Marylynn Pearson, MD    DATE OF BIRTH:  [birth year]  DATE OF PROCEDURE: DATE OF DISCHARGE:                              OPERATIVE REPORT   PREOPERATIVE DIAGNOSIS:  Menorrhagia.  POSTOPERATIVE DIAGNOSES: 1. Menorrhagia. 2. Endometriosis. 3. Dense pelvic adhesions.  PROCEDURE: 1. Diagnostic laparoscopy. 2. Total abdominal hysterectomy with bilateral salpingo-oophorectomy. 3. Lysis of adhesions.  SURGEON:  Marylynn Pearson, MD.  ASSISTANTRenold Don.  ESTIMATED BLOOD LOSS:  250 mL.  URINE OUTPUT:  Clear.  ANESTHESIA:  General.  COMPLICATIONS:  None.  SPECIMEN:  Uterus, cervix with bilateral tubes and ovaries.  CONDITION:  Stable to recovery room.  DESCRIPTION OF PROCEDURE:  The patient was taken to the operating room, where general anesthesia was obtained.  She was placed in the dorsal lithotomy position using Allen stirrups.  Prepped and draped in sterile fashion and a Foley catheter was inserted.  Bivalve speculum was placed in the vagina and a single-tooth tenaculum attached to the anterior lip of the cervix.  Hulka clamp was placed.  Tenaculum and speculum were removed and our attention was turned to the abdomen.  A 0.25% Marcaine was used to provide local anesthesia at the site of our infraumbilical incision.  Incision was made with a scalpel and extended bluntly to the level of the fascia using a Kelly clamp.  Optical trocar was inserted under direct visualization.  Once intraperitoneal placement was confirmed, CO2 was turned on and a survey was performed.  The patient was placed in Trendelenburg position.  The patient was noted to have dense adhesions of the bowel to the uterus and bilateral adnexa. The patient was taken out of Trendelenburg, trocar, and laparoscope were removed.  A deep stitch was  placed in the infraumbilical incision and the skin was closed with Vicryl, Hulka clamp was removed.  A Pfannenstiel skin incision was made with a scalpel and extended bluntly to the level of the fascia.  The fascia was incised in the midline and extended laterally using curved Mayo scissors.  The peritoneum was identified and entered sharply.  This was extended superiorly and inferiorly.  Moist and lap sponges were used to pack the bowel and O'Connor-O'Sullivan retractor was placed.  Sharp and blunt dissection were then used to dissect the bowel free of bilateral adnexa and the posterior uterine wall.  It was very difficult to visualize surgical planes because of the dense nature of the adhesions. Endometrioma was ruptured on the right during dissection.  The uterus was then grasped bilaterally with Kelly clamps.  Bilateral round ligaments were suture ligated and transected.  Incision was continued down the anterior broad ligament and the vesicouterine peritoneum was dissected free of the lower uterine segment and cervix.  The utero- ovarian ligament and fallopian tube were then doubly clamped, bilaterally cut, and suture ligated.  Uterine arteries were clamped, cut, and suture ligated.  Excellent hemostasis was noted.  Uterosacral ligaments were clamped, cut, and suture ligated.  The uterus was then amputated and the vaginal cuff reapproximated with  a series of figure-of- eight sutures.  Hemostasis was assured.  Uterus and cervix were passed off and our attention was then turned to the adnexal.  The right adnexa were noted to be adherent to the right side wall with a severely inflamed fallopian tube and endometrioma.  Adnexa were grasped with a Babcock clamp and the utero-ovarian ligament was clamped, cut, and doubly suture ligated.  Hemostasis was noted and this procedure was repeated on the patient's left side.  Bilateral ureters could be palpated and were noted to be free of the  surgical field.  However, they could not be visualized.  Irrigation was then performed.  All pedicles were reinspected and noted to be hemostatic and all instruments, retractors, and lap sponges were then removed from the abdomen. Peritoneal was closed with Monocryl.  The fascia was closed with PDS. Exparel was used to provide local anesthesia and the skin was closed with 4-0 Vicryl.  Sponge, lap, needle, and instrument counts were correct x2.  She was extubated and taken to the recovery room in stable condition.     Marylynn Pearson, MD     GA/MEDQ  D:  06/21/2015  T:  06/21/2015  Job:  [id number]

## 2015-06-21 NOTE — Anesthesia Preprocedure Evaluation (Signed)
Anesthesia Evaluation  Patient identified by MRN, date of birth, ID band Patient awake    Reviewed: Allergy & Precautions, H&P , Patient's Chart, lab work & pertinent test results, reviewed documented beta blocker date and time   Airway Mallampati: II  TM Distance: >3 FB Neck ROM: full    Dental no notable dental hx.    Pulmonary    Pulmonary exam normal breath sounds clear to auscultation       Cardiovascular  Rhythm:regular Rate:Normal     Neuro/Psych    GI/Hepatic   Endo/Other    Renal/GU      Musculoskeletal   Abdominal   Peds  Hematology   Anesthesia Other Findings   Reproductive/Obstetrics                             Anesthesia Physical Anesthesia Plan  ASA: II  Anesthesia Plan: General   Post-op Pain Management:    Induction: Intravenous  Airway Management Planned: Oral ETT  Additional Equipment:   Intra-op Plan:   Post-operative Plan: Extubation in OR  Informed Consent: I have reviewed the patients History and Physical, chart, labs and discussed the procedure including the risks, benefits and alternatives for the proposed anesthesia with the patient or authorized representative who has indicated his/her understanding and acceptance.   Dental Advisory Given and Dental advisory given  Plan Discussed with: CRNA and Surgeon  Anesthesia Plan Comments: (  Discussed general anesthesia, including possible nausea, instrumentation of airway, sore throat,pulmonary aspiration, etc. I asked if the were any outstanding questions, or  concerns before we proceeded.)        Anesthesia Quick Evaluation  

## 2015-06-21 NOTE — Op Note (Signed)
06/21/2015  9:31 AM  PATIENT:  Kathy Woods  49 y.o. female  PRE-OPERATIVE DIAGNOSIS:  menorrhagia   POST-OPERATIVE DIAGNOSIS:  menorrhagia   PROCEDURE:  Procedure(s): BILATERAL SALPINGO OOPHORECTOMY (Bilateral) LAPAROSCOPY DIAGNOSTIC (N/A) HYSTERECTOMY ABDOMINAL (N/A) LYSIS OF ADHESION (N/A)  SURGEON:  Surgeon(s) and Role:    * Marylynn Pearson, MD - Primary     PHYSICIAN ASSISTANT: Arvella Nigh, MD   ANESTHESIA:   general  EBL:  Total I/O In: 9767 [I.V.:2300] Out: 350 [Urine:100; Blood:250]  BLOOD ADMINISTERED:none  DRAINS: foley   LOCAL MEDICATIONS USED:  MARCAINE    and OTHER exparil  SPECIMEN:  Source of Specimen:  uterus, bilateral tubes and ovaries  DISPOSITION OF SPECIMEN:  PATHOLOGY  COUNTS:  YES  TOURNIQUET:  * No tourniquets in log *  DICTATION: .Other Dictation: Dictation Number pending  PLAN OF CARE: Admit to inpatient   PATIENT DISPOSITION:  PACU - hemodynamically stable.   Delay start of Pharmacological VTE agent (>24hrs) due to surgical blood loss or risk of bleeding: no

## 2015-06-21 NOTE — Anesthesia Procedure Notes (Signed)
Procedure Name: Intubation Date/Time: 06/21/2015 7:32 AM Performed by: Riki Sheer Pre-anesthesia Checklist: Patient identified, Emergency Drugs available, Suction available, Patient being monitored and Timeout performed Patient Re-evaluated:Patient Re-evaluated prior to inductionOxygen Delivery Method: Circle system utilized Preoxygenation: Pre-oxygenation with 100% oxygen Intubation Type: IV induction Ventilation: Mask ventilation without difficulty Laryngoscope Size: Miller and 2 Grade View: Grade I Tube type: Oral Tube size: 7.0 mm Number of attempts: 1 Airway Equipment and Method: Stylet Placement Confirmation: ETT inserted through vocal cords under direct vision,  positive ETCO2,  CO2 detector and breath sounds checked- equal and bilateral Secured at: 21 cm Tube secured with: Tape Dental Injury: Teeth and Oropharynx as per pre-operative assessment

## 2015-06-21 NOTE — Anesthesia Postprocedure Evaluation (Signed)
  Anesthesia Post-op Note  Patient: Kathy Woods  Procedure(s) Performed: Procedure(s) (LRB): BILATERAL SALPINGO OOPHORECTOMY (Bilateral) LAPAROSCOPY DIAGNOSTIC (N/A) HYSTERECTOMY ABDOMINAL (N/A) LYSIS OF ADHESION (N/A)  Patient Location: PACU  Anesthesia Type: Epidural  Level of Consciousness: awake and alert   Airway and Oxygen Therapy: Patient Spontanous Breathing  Post-op Pain: mild  Post-op Assessment: Post-op Vital signs reviewed, Patient's Cardiovascular Status Stable, Respiratory Function Stable, Patent Airway and No signs of Nausea or vomiting  Last Vitals:  Filed Vitals:   06/21/15 1015  BP:   Pulse:   Temp:   Resp: 16    Post-op Vital Signs: stable   Complications: No apparent anesthesia complications

## 2015-06-21 NOTE — Progress Notes (Signed)
Day of Surgery Procedure(s) (LRB): BILATERAL SALPINGO OOPHORECTOMY (Bilateral) LAPAROSCOPY DIAGNOSTIC (N/A) HYSTERECTOMY ABDOMINAL (N/A) LYSIS OF ADHESION (N/A)  Subjective: Patient reports incisional pain.    Objective: I have reviewed patient's vital signs, intake and output and medications.  General: alert and cooperative GI: normal findings: soft, non-tender Extremities: extremities normal, atraumatic, no cyanosis or edema Vaginal Bleeding: none  Assessment: s/p Procedure(s): BILATERAL SALPINGO OOPHORECTOMY (Bilateral) LAPAROSCOPY DIAGNOSTIC (N/A) HYSTERECTOMY ABDOMINAL (N/A) LYSIS OF ADHESION (N/A): stable  Plan: continue routine postop care  LOS: 0 days    Robertson Colclough 06/21/2015, 1:37 PM

## 2015-06-21 NOTE — Anesthesia Postprocedure Evaluation (Signed)
  Anesthesia Post-op Note  Patient: Kathy Woods  Procedure(s) Performed: Procedure(s): BILATERAL SALPINGO OOPHORECTOMY (Bilateral) LAPAROSCOPY DIAGNOSTIC (N/A) HYSTERECTOMY ABDOMINAL (N/A) LYSIS OF ADHESION (N/A)  Patient Location: [facility]  Anesthesia Type:General  Level of Consciousness: awake, alert , oriented and patient cooperative  Airway and Oxygen Therapy: Patient Spontanous Breathing and Patient connected to nasal cannula oxygen  Post-op Pain: none  Post-op Assessment: Post-op Vital signs reviewed, Patient's Cardiovascular Status Stable, Respiratory Function Stable, Patent Airway and No signs of Nausea or vomiting              Post-op Vital Signs: Reviewed and stable  Last Vitals:  Filed Vitals:   06/21/15 1139  BP: 108/69  Pulse: 64  Temp: 37.1 C  Resp: 16    Complications: No apparent anesthesia complications

## 2015-06-22 ENCOUNTER — Encounter (HOSPITAL_COMMUNITY): Payer: Self-pay | Admitting: Obstetrics and Gynecology

## 2015-06-22 LAB — CBC
HCT: 31 % — ABNORMAL LOW (ref 36.0–46.0)
Hemoglobin: 10.9 g/dL — ABNORMAL LOW (ref 12.0–15.0)
MCH: 31.4 pg (ref 26.0–34.0)
MCHC: 35.2 g/dL (ref 30.0–36.0)
MCV: 89.3 fL (ref 78.0–100.0)
Platelets: 177 10*3/uL (ref 150–400)
RBC: 3.47 MIL/uL — AB (ref 3.87–5.11)
RDW: 11.8 % (ref 11.5–15.5)
WBC: 10.7 10*3/uL — AB (ref 4.0–10.5)

## 2015-06-22 MED ORDER — SENNOSIDES-DOCUSATE SODIUM 8.6-50 MG PO TABS
1.0000 | ORAL_TABLET | Freq: Two times a day (BID) | ORAL | Status: DC
  Administered 2015-06-22 – 2015-06-23 (×2): 1 via ORAL
  Filled 2015-06-22 (×2): qty 1

## 2015-06-22 NOTE — Progress Notes (Signed)
1 Day Post-Op Procedure(s) (LRB): BILATERAL SALPINGO OOPHORECTOMY (Bilateral) LAPAROSCOPY DIAGNOSTIC (N/A) HYSTERECTOMY ABDOMINAL (N/A) LYSIS OF ADHESION (N/A)  Subjective: Patient reports incisional pain, tolerating PO, + flatus and no problems voiding.    Objective: I have reviewed patient's vital signs, intake and output, medications and labs.  General: alert and cooperative GI: normal findings: soft, non-tender Extremities: extremities normal, atraumatic, no cyanosis or edema Vaginal Bleeding: none  Assessment: s/p Procedure(s): BILATERAL SALPINGO OOPHORECTOMY (Bilateral) LAPAROSCOPY DIAGNOSTIC (N/A) HYSTERECTOMY ABDOMINAL (N/A) LYSIS OF ADHESION (N/A): stable and progressing well  Plan: continue current care  LOS: 1 day    Kathy Woods 06/22/2015, 1:41 PM

## 2015-06-22 NOTE — Progress Notes (Signed)
1 Day Post-Op Procedure(s) (LRB): BILATERAL SALPINGO OOPHORECTOMY (Bilateral) LAPAROSCOPY DIAGNOSTIC (N/A) HYSTERECTOMY ABDOMINAL (N/A) LYSIS OF ADHESION (N/A)  Subjective: Patient reports incisional pain and + flatus.  Pain controlled w/ IV meds  Objective: I have reviewed patient's vital signs, intake and output, medications and labs.  General: alert and cooperative GI: normal findings: soft, non-tender Extremities: extremities normal, atraumatic, no cyanosis or edema Vaginal Bleeding: none  Assessment: s/p Procedure(s): BILATERAL SALPINGO OOPHORECTOMY (Bilateral) LAPAROSCOPY DIAGNOSTIC (N/A) HYSTERECTOMY ABDOMINAL (N/A) LYSIS OF ADHESION (N/A): stable and progressing well  Plan: Advance diet Encourage ambulation Advance to PO medication Discontinue IV fluids  LOS: 1 day    Geri Hepler 06/22/2015, 7:30 AM

## 2015-06-23 MED ORDER — OXYCODONE-ACETAMINOPHEN 5-325 MG PO TABS: 1.0000 | ORAL_TABLET | ORAL | Status: AC | PRN

## 2015-06-23 NOTE — Progress Notes (Signed)
Discharge teaching complete. Pt understood all instructions and did not have any questions. Pt ambulated out of the hospital and discharged home to family.  

## 2015-06-23 NOTE — Discharge Summary (Signed)
Physician Discharge Summary  Patient ID: Kathy Woods MRN: [id number] DOB/AGE: May 31, 1966 49 y.o.  Admit date: 06/21/2015 Discharge date: 06/23/2015  Admission Diagnoses:  menorrhagia  Discharge Diagnoses:  Active Problems:   Menorrhagia   Discharged Condition: good  Hospital Course: Pt was admitted for post op care.  Pain was initially controlled with IV PCA.  As her diet was advanced, she was controlled with PO meds.  Labs and vitals stable throughout her hospital stay.  On POD2, she was ambulating and voiding without difficulty.  + flatus.  Stable for discharge.    Consults: None  Significant Diagnostic Studies: labs: cbc  Treatments: surgery: laparoscopy, lysis of adhesions, TAH/BSO  Discharge Exam: Blood pressure 108/68, pulse 65, temperature 98 F (36.7 C), temperature source Oral, resp. rate 18, height 5\' 6"  (1.676 m), weight 177 lb (80.287 kg), SpO2 100 %. General appearance: alert and cooperative GI: normal findings: soft, non-tender Extremities: extremities normal, atraumatic, no cyanosis or edema Incision/Wound:c/d/i, bandage intact & dry  Disposition: 01-Home or Self Care     Medication List    STOP taking these medications        IRON PO      TAKE these medications        B-12 1000 MCG Subl  Place 1 tablet under the tongue daily.     CALCIUM PO  Take 1 tablet by mouth 3 (three) times daily.     cholecalciferol 1000 UNITS tablet  Commonly known as:  VITAMIN D  Take 1,000 Units by mouth daily.     Magnesium 250 MG Tabs  Take 250 mg by mouth daily.     multivitamin tablet  Take 1 tablet by mouth 3 (three) times daily.     oxyCODONE-acetaminophen 5-325 MG tablet  Commonly known as:  PERCOCET/ROXICET  Take 1-2 tablets by mouth every 4 (four) hours as needed for severe pain (moderate to severe pain (when tolerating fluids)).     ZINC PO  Take 1 tablet by mouth daily.           Follow-up Information    Schedule an appointment as soon as  possible for a visit in 2 weeks to follow up.      Signed: Taylen Osorto 06/23/2015, 8:35 AM

## 2015-06-23 NOTE — Discharge Instructions (Signed)
Abdominal Hysterectomy, Care After °Refer to this sheet in the next few weeks. These instructions provide you with information on caring for yourself after your procedure. Your health care provider may also give you more specific instructions. Your treatment has been planned according to current medical practices, but problems sometimes occur. Call your health care provider if you have any problems or questions after your procedure.  °WHAT TO EXPECT AFTER THE PROCEDURE °After your procedure, it is typical to have the following: °· Pain. °· Feeling tired. °· Poor appetite. °· Less interest in sex. °It takes 4-6 weeks to recover from this surgery.  °HOME CARE INSTRUCTIONS  °· Take pain medicines only as directed by your health care provider. Do not take over-the-counter pain medicines without checking with your health care provider first.  °· Change your bandage as directed by your health care provider. °· Return to your health care provider to have your sutures taken out. °· Take showers instead of baths for 2-3 weeks. Ask your health care provider when it is safe to start showering.  °· Do not douche, use tampons, or have sexual intercourse for at least 6 weeks or until your health care provider says you can.   °· Follow your health care provider's advice about exercise, lifting, driving, and general activities. °· Get plenty of rest and sleep.   °· Do not lift anything heavier than a gallon of milk (about 10 lb [4.5 kg]) for the first month after surgery. °· You can resume your normal diet if your health care provider says it is okay.   °· Do not drink alcohol until your health care provider says you can.   °· If you are constipated, ask your health care provider if you can take a mild laxative. °· Eating foods high in fiber may also help with constipation. Eat plenty of raw fruits and vegetables, whole grains, and beans. °· Drink enough fluids to keep your urine clear or pale yellow.   °· Try to have someone at  home with you for the first 1-2 weeks to help around the house. °· Keep all follow-up appointments. °SEEK MEDICAL CARE IF:  °· You have chills or fever. °· You have swelling, redness, or pain in the area of your incision that is getting worse.   °· You have pus coming from the incision.   °· You notice a bad smell coming from the incision or bandage.   °· Your incision breaks open.   °· You feel dizzy or light-headed.   °· You have pain or bleeding when you urinate.   °· You have persistent diarrhea.   °· You have persistent nausea and vomiting.   °· You have abnormal vaginal discharge.   °· You have a rash.   °· You have any type of abnormal reaction or develop an allergy to your medicine.   °· Your pain medicine is not helping.   °SEEK IMMEDIATE MEDICAL CARE IF:  °· You have a fever and your symptoms suddenly get worse. °· You have severe abdominal pain. °· You have chest pain. °· You have shortness of breath. °· You faint. °· You have pain, swelling, or redness of your leg. °· You have heavy vaginal bleeding with blood clots. °MAKE SURE YOU: °· Understand these instructions. °· Will watch your condition. °· Will get help right away if you are not doing well or get worse. °  °This information is not intended to replace advice given to you by your health care provider. Make sure you discuss any questions you have with your health care provider. °  °Document   Released: 03/03/2005 Document Revised: 09/04/2014 Document Reviewed: 06/06/2013 °[employer] Interactive Patient Education ©2016 [employer] ° °

## 2016-05-03 ENCOUNTER — Other Ambulatory Visit: Payer: Self-pay | Admitting: Obstetrics and Gynecology

## 2016-05-03 DIAGNOSIS — Z1231 Encounter for screening mammogram for malignant neoplasm of breast: Secondary | ICD-10-CM

## 2016-05-09 ENCOUNTER — Ambulatory Visit (INDEPENDENT_AMBULATORY_CARE_PROVIDER_SITE_OTHER): Payer: BLUE CROSS/BLUE SHIELD

## 2016-05-09 DIAGNOSIS — Z1231 Encounter for screening mammogram for malignant neoplasm of breast: Secondary | ICD-10-CM

## 2017-04-10 ENCOUNTER — Other Ambulatory Visit: Payer: Self-pay | Admitting: Obstetrics and Gynecology

## 2017-04-10 DIAGNOSIS — Z1231 Encounter for screening mammogram for malignant neoplasm of breast: Secondary | ICD-10-CM

## 2017-05-18 ENCOUNTER — Ambulatory Visit (INDEPENDENT_AMBULATORY_CARE_PROVIDER_SITE_OTHER): Payer: BLUE CROSS/BLUE SHIELD

## 2017-05-18 DIAGNOSIS — Z1231 Encounter for screening mammogram for malignant neoplasm of breast: Secondary | ICD-10-CM | POA: Diagnosis not present

## 2018-04-26 ENCOUNTER — Other Ambulatory Visit: Payer: Self-pay | Admitting: Obstetrics and Gynecology

## 2018-04-26 DIAGNOSIS — Z1231 Encounter for screening mammogram for malignant neoplasm of breast: Secondary | ICD-10-CM

## 2018-05-24 ENCOUNTER — Ambulatory Visit (INDEPENDENT_AMBULATORY_CARE_PROVIDER_SITE_OTHER): Payer: BLUE CROSS/BLUE SHIELD

## 2018-05-24 DIAGNOSIS — Z1231 Encounter for screening mammogram for malignant neoplasm of breast: Secondary | ICD-10-CM | POA: Diagnosis not present

## 2018-05-30 DIAGNOSIS — Z9884 Bariatric surgery status: Secondary | ICD-10-CM | POA: Diagnosis not present

## 2019-05-07 DIAGNOSIS — N644 Mastodynia: Secondary | ICD-10-CM | POA: Diagnosis not present

## 2019-05-07 DIAGNOSIS — Z6833 Body mass index (BMI) 33.0-33.9, adult: Secondary | ICD-10-CM | POA: Diagnosis not present

## 2019-05-07 DIAGNOSIS — Z01419 Encounter for gynecological examination (general) (routine) without abnormal findings: Secondary | ICD-10-CM | POA: Diagnosis not present

## 2019-05-08 ENCOUNTER — Other Ambulatory Visit: Payer: Self-pay | Admitting: Obstetrics and Gynecology

## 2019-05-08 DIAGNOSIS — N644 Mastodynia: Secondary | ICD-10-CM

## 2019-05-26 ENCOUNTER — Other Ambulatory Visit: Payer: Self-pay

## 2019-05-26 ENCOUNTER — Ambulatory Visit
Admission: RE | Admit: 2019-05-26 | Discharge: 2019-05-26 | Disposition: A | Discharge status: Home / Self Care | Payer: BLUE CROSS/BLUE SHIELD | Source: Ambulatory Visit | Attending: Obstetrics and Gynecology | Admitting: Obstetrics and Gynecology

## 2019-05-26 ENCOUNTER — Ambulatory Visit
Admission: RE | Admit: 2019-05-26 | Discharge: 2019-05-26 | Disposition: A | Discharge status: Home / Self Care | Payer: BC Managed Care – PPO | Source: Ambulatory Visit | Attending: Obstetrics and Gynecology | Admitting: Obstetrics and Gynecology

## 2019-05-26 DIAGNOSIS — N644 Mastodynia: Secondary | ICD-10-CM

## 2019-06-12 DIAGNOSIS — Z0000 Encounter for general adult medical examination without abnormal findings: Secondary | ICD-10-CM | POA: Diagnosis not present

## 2019-07-28 DIAGNOSIS — Z1211 Encounter for screening for malignant neoplasm of colon: Secondary | ICD-10-CM | POA: Diagnosis not present

## 2019-10-08 DIAGNOSIS — L650 Telogen effluvium: Secondary | ICD-10-CM | POA: Diagnosis not present

## 2020-04-19 DIAGNOSIS — Z1211 Encounter for screening for malignant neoplasm of colon: Secondary | ICD-10-CM | POA: Diagnosis not present

## 2020-05-06 ENCOUNTER — Other Ambulatory Visit: Payer: Self-pay | Admitting: Obstetrics and Gynecology

## 2020-05-06 DIAGNOSIS — Z1231 Encounter for screening mammogram for malignant neoplasm of breast: Secondary | ICD-10-CM

## 2020-06-09 ENCOUNTER — Other Ambulatory Visit: Payer: Self-pay

## 2020-06-09 ENCOUNTER — Ambulatory Visit (INDEPENDENT_AMBULATORY_CARE_PROVIDER_SITE_OTHER): Payer: BC Managed Care – PPO

## 2020-06-09 DIAGNOSIS — Z1231 Encounter for screening mammogram for malignant neoplasm of breast: Secondary | ICD-10-CM

## 2020-06-28 DIAGNOSIS — Z0000 Encounter for general adult medical examination without abnormal findings: Secondary | ICD-10-CM | POA: Diagnosis not present

## 2020-06-28 DIAGNOSIS — Z23 Encounter for immunization: Secondary | ICD-10-CM | POA: Diagnosis not present

## 2020-12-14 DIAGNOSIS — Z01419 Encounter for gynecological examination (general) (routine) without abnormal findings: Secondary | ICD-10-CM | POA: Diagnosis not present

## 2020-12-14 DIAGNOSIS — Z6834 Body mass index (BMI) 34.0-34.9, adult: Secondary | ICD-10-CM | POA: Diagnosis not present

## 2021-07-12 ENCOUNTER — Other Ambulatory Visit: Payer: Self-pay | Admitting: Family Medicine

## 2021-07-12 ENCOUNTER — Other Ambulatory Visit: Payer: Self-pay | Admitting: Surgery

## 2021-07-12 DIAGNOSIS — Z1231 Encounter for screening mammogram for malignant neoplasm of breast: Secondary | ICD-10-CM

## 2021-07-12 DIAGNOSIS — Z9884 Bariatric surgery status: Secondary | ICD-10-CM

## 2021-07-12 DIAGNOSIS — Z0000 Encounter for general adult medical examination without abnormal findings: Secondary | ICD-10-CM | POA: Diagnosis not present

## 2021-08-03 ENCOUNTER — Ambulatory Visit: Payer: BC Managed Care – PPO

## 2021-08-10 ENCOUNTER — Ambulatory Visit (INDEPENDENT_AMBULATORY_CARE_PROVIDER_SITE_OTHER): Payer: BC Managed Care – PPO

## 2021-08-10 ENCOUNTER — Other Ambulatory Visit: Payer: Self-pay

## 2021-08-10 ENCOUNTER — Other Ambulatory Visit: Payer: Self-pay | Admitting: Family Medicine

## 2021-08-10 DIAGNOSIS — Z780 Asymptomatic menopausal state: Secondary | ICD-10-CM

## 2021-08-10 DIAGNOSIS — Z1231 Encounter for screening mammogram for malignant neoplasm of breast: Secondary | ICD-10-CM

## 2021-08-10 DIAGNOSIS — M8589 Other specified disorders of bone density and structure, multiple sites: Secondary | ICD-10-CM | POA: Diagnosis not present

## 2021-08-16 ENCOUNTER — Other Ambulatory Visit: Payer: BC Managed Care – PPO

## 2022-01-02 DIAGNOSIS — Z01419 Encounter for gynecological examination (general) (routine) without abnormal findings: Secondary | ICD-10-CM | POA: Diagnosis not present

## 2022-01-02 DIAGNOSIS — Z6834 Body mass index (BMI) 34.0-34.9, adult: Secondary | ICD-10-CM | POA: Diagnosis not present

## 2022-05-20 IMAGING — MG DIGITAL SCREENING BILAT W/ TOMO W/ CAD
6 of 10 series · 6 of 30 positions shown · non-contrast
Comparison: Previous exam(s).

CLINICAL DATA: Screening.

EXAM:
DIGITAL SCREENING BILATERAL MAMMOGRAM WITH TOMO AND CAD

[R MLO synth-2D]
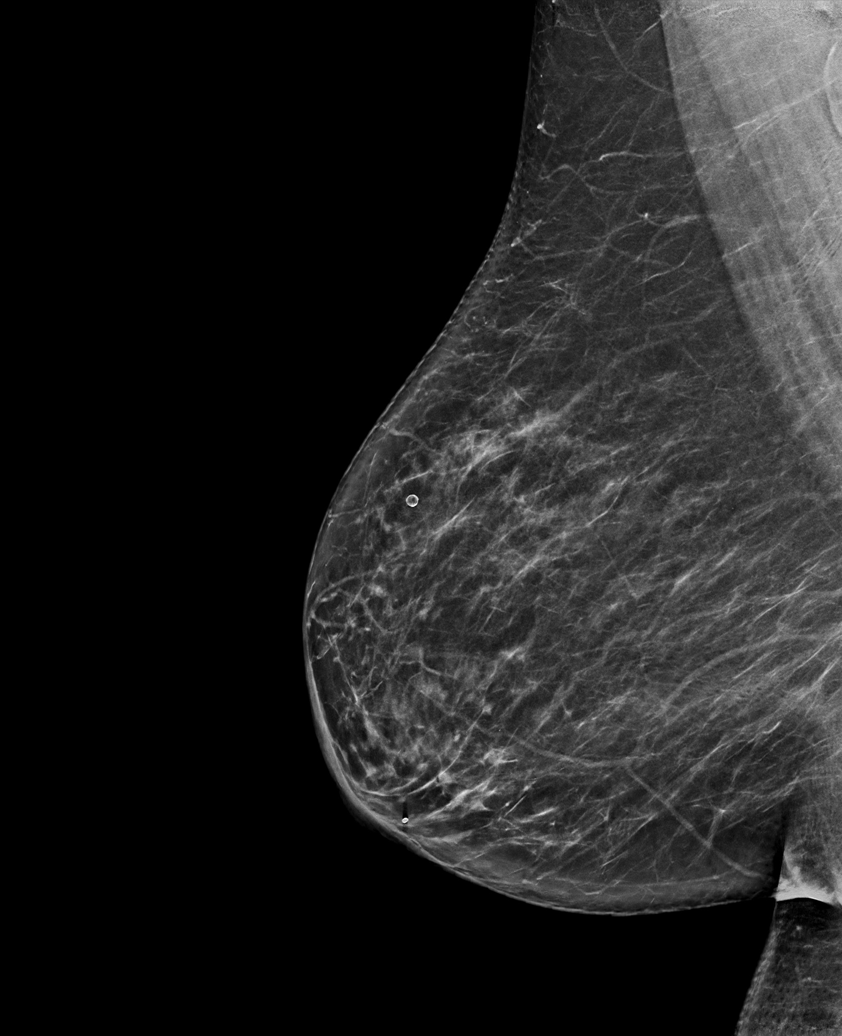

[L MLO synth-2D]
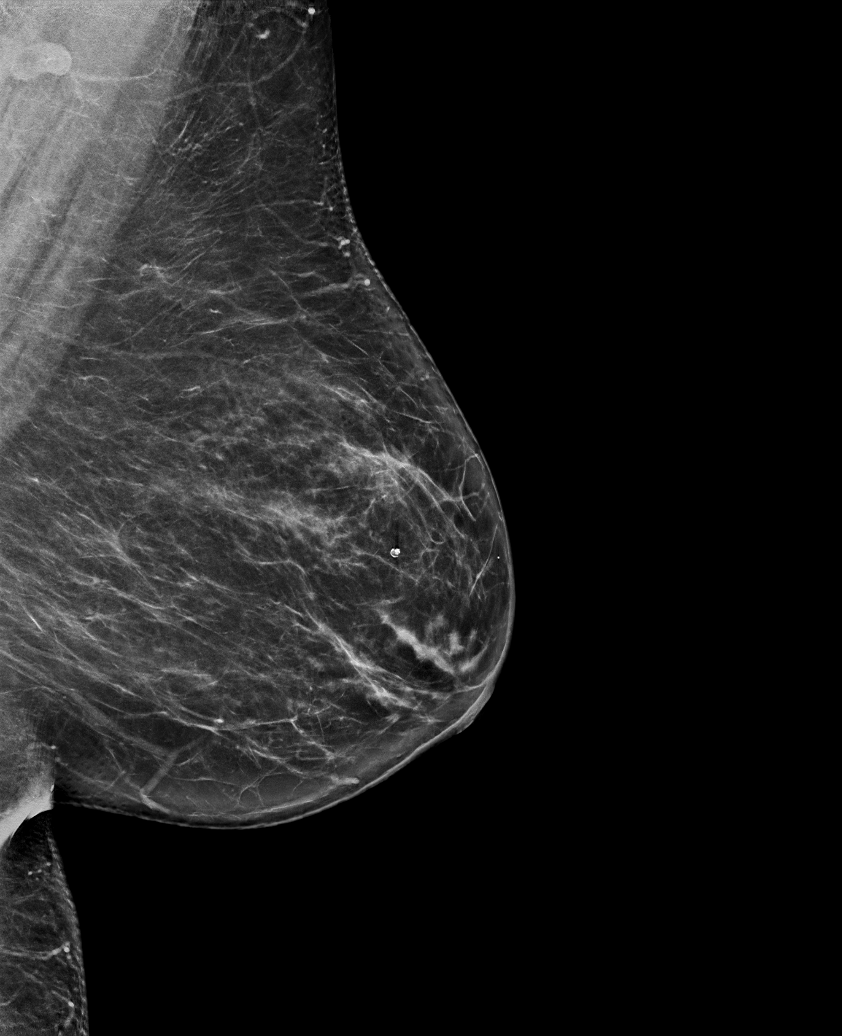

[R XCCL synth-2D]
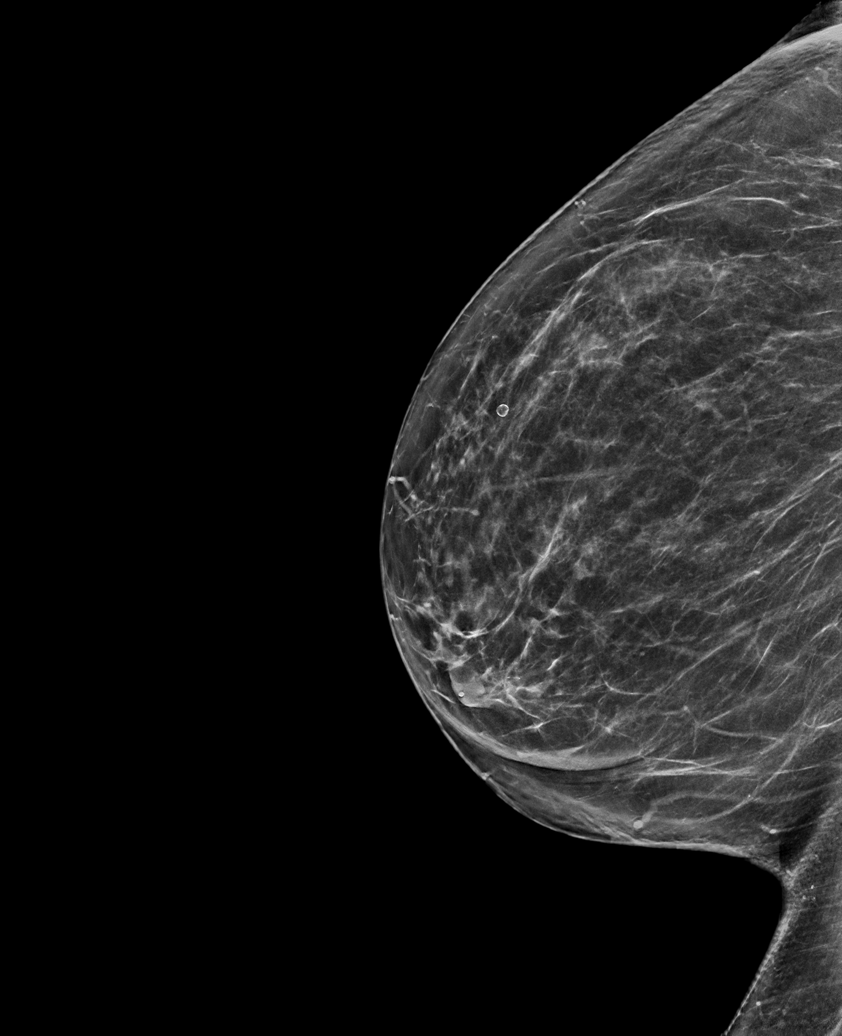

[R CC synth-2D]
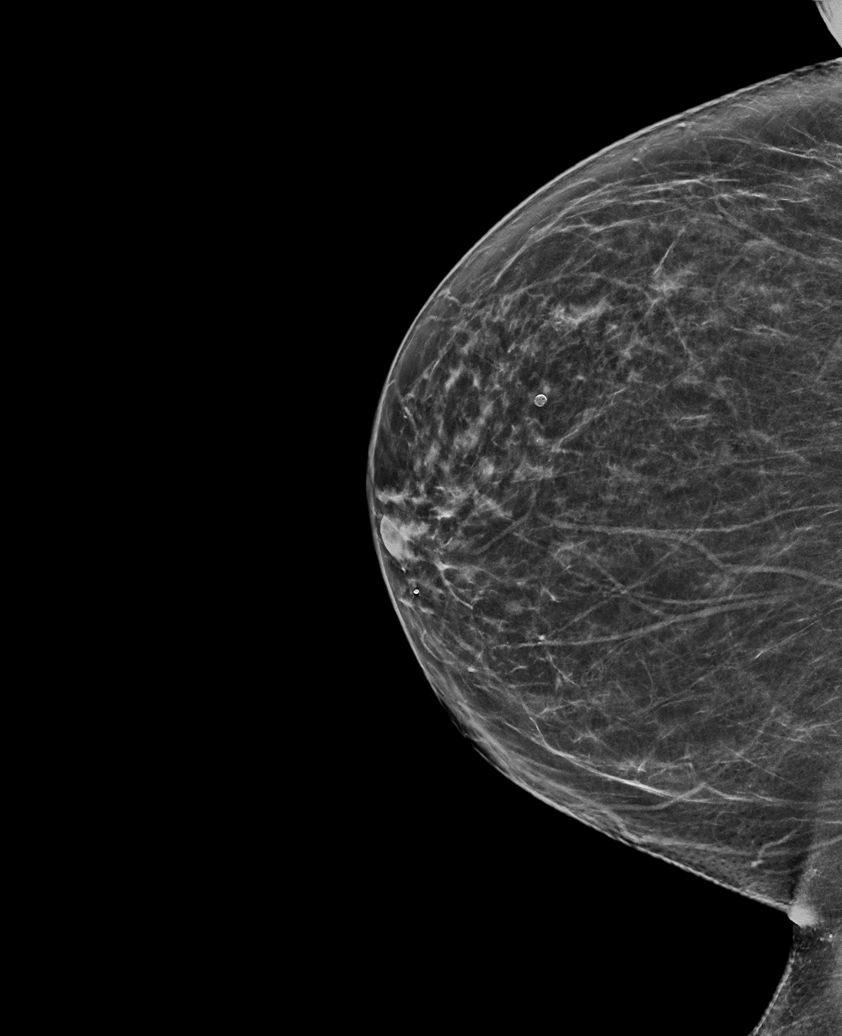

[L CC synth-2D]
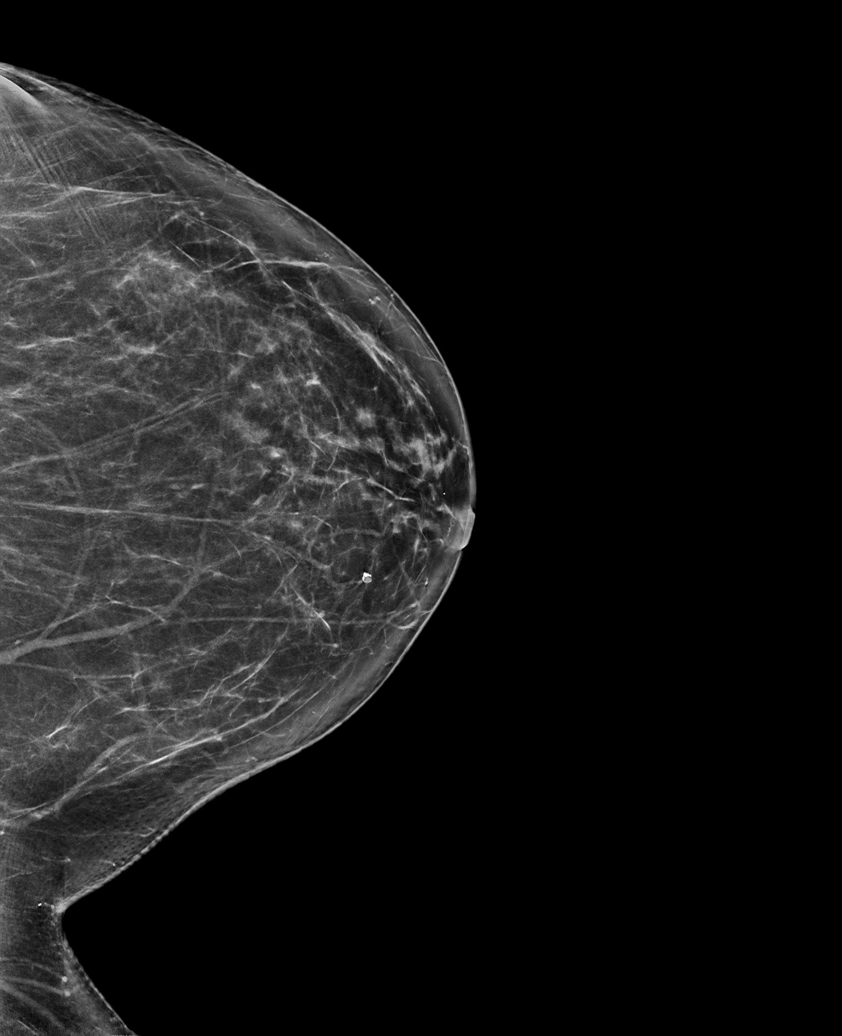

[R XCCL tomo · tomo slice 39/77.0]
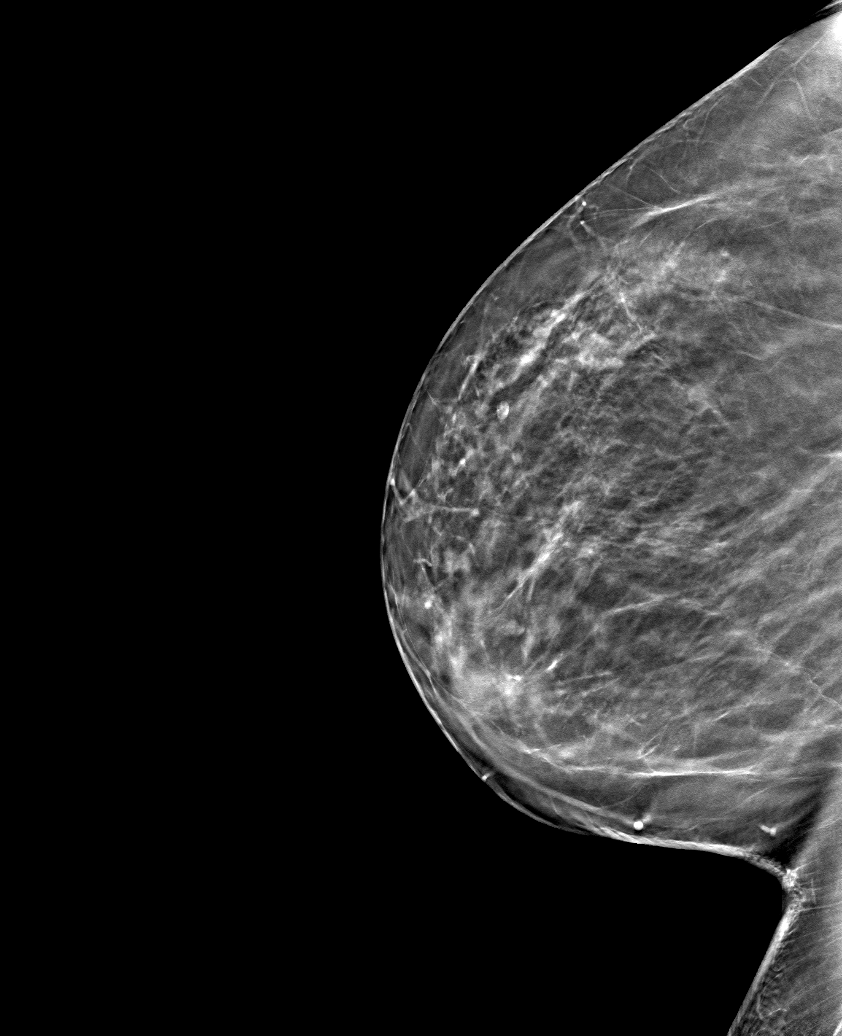

[6 of 30 positions shown; findings below may reference images not displayed]

ACR Breast Density Category b: There are scattered areas of
fibroglandular density.
FINDINGS: There are no findings suspicious for malignancy. Images were
processed with CAD.
IMPRESSION: No mammographic evidence of malignancy. A result letter of this
screening mammogram will be mailed directly to the patient.

RECOMMENDATION:
Screening mammogram in one year. (Code:[id number])

BI-RADS CATEGORY  1: Negative.

## 2022-07-25 DIAGNOSIS — Z0000 Encounter for general adult medical examination without abnormal findings: Secondary | ICD-10-CM | POA: Diagnosis not present

## 2022-09-18 DIAGNOSIS — L304 Erythema intertrigo: Secondary | ICD-10-CM | POA: Diagnosis not present

## 2022-10-16 ENCOUNTER — Other Ambulatory Visit: Payer: Self-pay | Admitting: Family Medicine

## 2022-10-16 DIAGNOSIS — Z1231 Encounter for screening mammogram for malignant neoplasm of breast: Secondary | ICD-10-CM

## 2022-11-08 ENCOUNTER — Ambulatory Visit (INDEPENDENT_AMBULATORY_CARE_PROVIDER_SITE_OTHER): Payer: BC Managed Care – PPO

## 2022-11-08 DIAGNOSIS — Z1231 Encounter for screening mammogram for malignant neoplasm of breast: Secondary | ICD-10-CM | POA: Diagnosis not present

## 2023-01-05 DIAGNOSIS — Z6837 Body mass index (BMI) 37.0-37.9, adult: Secondary | ICD-10-CM | POA: Diagnosis not present

## 2023-01-05 DIAGNOSIS — Z01419 Encounter for gynecological examination (general) (routine) without abnormal findings: Secondary | ICD-10-CM | POA: Diagnosis not present

## 2023-05-21 DIAGNOSIS — M9901 Segmental and somatic dysfunction of cervical region: Secondary | ICD-10-CM | POA: Diagnosis not present

## 2023-05-21 DIAGNOSIS — M9902 Segmental and somatic dysfunction of thoracic region: Secondary | ICD-10-CM | POA: Diagnosis not present

## 2023-05-21 DIAGNOSIS — M9903 Segmental and somatic dysfunction of lumbar region: Secondary | ICD-10-CM | POA: Diagnosis not present

## 2023-05-21 DIAGNOSIS — M5382 Other specified dorsopathies, cervical region: Secondary | ICD-10-CM | POA: Diagnosis not present

## 2023-06-04 DIAGNOSIS — M9903 Segmental and somatic dysfunction of lumbar region: Secondary | ICD-10-CM | POA: Diagnosis not present

## 2023-06-04 DIAGNOSIS — M5382 Other specified dorsopathies, cervical region: Secondary | ICD-10-CM | POA: Diagnosis not present

## 2023-06-04 DIAGNOSIS — M9901 Segmental and somatic dysfunction of cervical region: Secondary | ICD-10-CM | POA: Diagnosis not present

## 2023-06-04 DIAGNOSIS — M9902 Segmental and somatic dysfunction of thoracic region: Secondary | ICD-10-CM | POA: Diagnosis not present

## 2023-06-14 DIAGNOSIS — M5382 Other specified dorsopathies, cervical region: Secondary | ICD-10-CM | POA: Diagnosis not present

## 2023-06-14 DIAGNOSIS — M9903 Segmental and somatic dysfunction of lumbar region: Secondary | ICD-10-CM | POA: Diagnosis not present

## 2023-06-14 DIAGNOSIS — M9902 Segmental and somatic dysfunction of thoracic region: Secondary | ICD-10-CM | POA: Diagnosis not present

## 2023-06-14 DIAGNOSIS — M9901 Segmental and somatic dysfunction of cervical region: Secondary | ICD-10-CM | POA: Diagnosis not present

## 2023-06-18 DIAGNOSIS — M9903 Segmental and somatic dysfunction of lumbar region: Secondary | ICD-10-CM | POA: Diagnosis not present

## 2023-06-18 DIAGNOSIS — M5382 Other specified dorsopathies, cervical region: Secondary | ICD-10-CM | POA: Diagnosis not present

## 2023-06-18 DIAGNOSIS — M9901 Segmental and somatic dysfunction of cervical region: Secondary | ICD-10-CM | POA: Diagnosis not present

## 2023-06-18 DIAGNOSIS — M9902 Segmental and somatic dysfunction of thoracic region: Secondary | ICD-10-CM | POA: Diagnosis not present

## 2023-07-31 DIAGNOSIS — Z0000 Encounter for general adult medical examination without abnormal findings: Secondary | ICD-10-CM | POA: Diagnosis not present

## 2023-10-29 DIAGNOSIS — L218 Other seborrheic dermatitis: Secondary | ICD-10-CM | POA: Diagnosis not present

## 2023-10-29 DIAGNOSIS — L304 Erythema intertrigo: Secondary | ICD-10-CM | POA: Diagnosis not present

## 2023-12-11 ENCOUNTER — Other Ambulatory Visit: Payer: Self-pay | Admitting: Obstetrics and Gynecology

## 2023-12-11 DIAGNOSIS — Z1231 Encounter for screening mammogram for malignant neoplasm of breast: Secondary | ICD-10-CM

## 2024-02-13 ENCOUNTER — Ambulatory Visit

## 2024-02-28 ENCOUNTER — Ambulatory Visit

## 2024-02-28 DIAGNOSIS — Z1231 Encounter for screening mammogram for malignant neoplasm of breast: Secondary | ICD-10-CM

## 2024-07-30 DIAGNOSIS — Z01419 Encounter for gynecological examination (general) (routine) without abnormal findings: Secondary | ICD-10-CM | POA: Diagnosis not present

## 2024-07-30 DIAGNOSIS — Z6829 Body mass index (BMI) 29.0-29.9, adult: Secondary | ICD-10-CM | POA: Diagnosis not present

## 2024-07-30 DIAGNOSIS — Z1151 Encounter for screening for human papillomavirus (HPV): Secondary | ICD-10-CM | POA: Diagnosis not present

## 2024-08-08 DIAGNOSIS — Z0000 Encounter for general adult medical examination without abnormal findings: Secondary | ICD-10-CM | POA: Diagnosis not present
